# Patient Record
Sex: Female | Born: 2000 | Race: White | Hispanic: No | Marital: Single | Smoking: Never smoker
Health system: Southern US, Community
[De-identification: ages and names within clinical notes are randomized; demographics above are authoritative.]

## PROBLEM LIST (undated history)

## (undated) DIAGNOSIS — F909 Attention-deficit hyperactivity disorder, unspecified type: Secondary | ICD-10-CM

## (undated) DIAGNOSIS — J020 Streptococcal pharyngitis: Secondary | ICD-10-CM

---
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
## 2022-01-28 DIAGNOSIS — R634 Abnormal weight loss: Secondary | ICD-10-CM | POA: Diagnosis not present

## 2022-01-28 DIAGNOSIS — R002 Palpitations: Secondary | ICD-10-CM | POA: Diagnosis not present

## 2022-01-28 DIAGNOSIS — N390 Urinary tract infection, site not specified: Secondary | ICD-10-CM | POA: Diagnosis not present

## 2022-01-28 DIAGNOSIS — Z3202 Encounter for pregnancy test, result negative: Secondary | ICD-10-CM | POA: Diagnosis not present

## 2022-01-28 DIAGNOSIS — A499 Bacterial infection, unspecified: Secondary | ICD-10-CM | POA: Diagnosis not present

## 2022-01-28 DIAGNOSIS — J029 Acute pharyngitis, unspecified: Secondary | ICD-10-CM | POA: Diagnosis not present

## 2022-01-28 DIAGNOSIS — R590 Localized enlarged lymph nodes: Secondary | ICD-10-CM | POA: Diagnosis not present

## 2022-01-28 DIAGNOSIS — R7309 Other abnormal glucose: Secondary | ICD-10-CM | POA: Diagnosis not present

## 2022-01-28 DIAGNOSIS — E061 Subacute thyroiditis: Secondary | ICD-10-CM | POA: Diagnosis not present

## 2022-02-06 ENCOUNTER — Emergency Department
Admission: EM | Admit: 2022-02-06 | Discharge: 2022-02-06 | Disposition: A | Discharge status: Home / Self Care | Payer: 59 | Attending: Emergency Medicine | Admitting: Emergency Medicine

## 2022-02-06 ENCOUNTER — Emergency Department: Payer: 59

## 2022-02-06 DIAGNOSIS — R002 Palpitations: Secondary | ICD-10-CM | POA: Diagnosis not present

## 2022-02-06 DIAGNOSIS — F419 Anxiety disorder, unspecified: Secondary | ICD-10-CM | POA: Diagnosis not present

## 2022-02-06 DIAGNOSIS — R200 Anesthesia of skin: Secondary | ICD-10-CM | POA: Insufficient documentation

## 2022-02-06 DIAGNOSIS — R0789 Other chest pain: Secondary | ICD-10-CM | POA: Diagnosis not present

## 2022-02-06 DIAGNOSIS — R258 Other abnormal involuntary movements: Secondary | ICD-10-CM | POA: Insufficient documentation

## 2022-02-06 DIAGNOSIS — R69 Illness, unspecified: Secondary | ICD-10-CM | POA: Diagnosis not present

## 2022-02-06 DIAGNOSIS — R9431 Abnormal electrocardiogram [ECG] [EKG]: Secondary | ICD-10-CM | POA: Diagnosis not present

## 2022-02-06 LAB — BASIC METABOLIC PANEL
Anion gap: 5 (ref 5–15)
BUN: 15 mg/dL (ref 6–20)
CO2: 25 mmol/L (ref 22–32)
Calcium: 8.8 mg/dL — ABNORMAL LOW (ref 8.9–10.3)
Chloride: 105 mmol/L (ref 98–111)
Creatinine, Ser: 0.62 mg/dL (ref 0.44–1.00)
GFR, Estimated: >60 mL/min (ref >60)
Glucose, Bld: 84 mg/dL (ref 70–99)
Potassium: 3.8 mmol/L (ref 3.5–5.1)
Sodium: 135 mmol/L (ref 135–145)

## 2022-02-06 LAB — CBC
HCT: 40.7 % (ref 36.0–46.0)
Hemoglobin: 13.3 g/dL (ref 12.0–15.0)
MCH: 27.3 pg (ref 26.0–34.0)
MCHC: 32.7 g/dL (ref 30.0–36.0)
MCV: 83.6 fL (ref 80.0–100.0)
Platelets: 374 10*3/uL (ref 150–400)
RBC: 4.87 MIL/uL (ref 3.87–5.11)
RDW: 14.7 % (ref 11.5–15.5)
WBC: 11 10*3/uL — ABNORMAL HIGH (ref 4.0–10.5)
nRBC: 0 % (ref 0.0–0.2)

## 2022-02-06 LAB — TROPONIN I (HIGH SENSITIVITY): Troponin I (High Sensitivity): <2 ng/L (ref <18)

## 2022-02-06 LAB — TSH: TSH: 1.093 u[IU]/mL (ref 0.350–4.500)

## 2022-02-06 LAB — POC URINE PREG, ED: Preg Test, Ur: NEGATIVE

## 2022-02-06 MED ORDER — HYDROXYZINE HCL 10 MG PO TABS: 10.0000 mg | ORAL_TABLET | Freq: Three times a day (TID) | ORAL | 0 refills | Status: AC | PRN

## 2022-02-06 NOTE — ED Provider Notes (Signed)
Medical screening examination/treatment/procedure(s) were conducted as a shared visit with non-physician practitioner(s) and myself.  I personally evaluated the patient during the encounter. ? ? ?ED ECG REPORT ?Monika Salk, the attending physician, personally viewed and interpreted this ECG. ? ?Date: 02/06/2022 ?EKG Time: 1113 ?Rate: 80 ?Rhythm: normal sinus rhythm ?QRS Axis: normal ?Intervals: normal ?ST/T Wave abnormalities: normal ?Narrative Interpretation: no evidence of acute ischemia ? ? ?Vitals:  ? 02/06/22 1101  ?BP: 114/81  ?Pulse: 77  ?Resp: 18  ?Temp: 99.4 ?F (37.4 ?C)  ?SpO2: 96%  ? ? ? ?Patient resting comfortably at this time.  No acute distress. ? ?Return precautions and treatment recommendations and follow-up discussed with the patient who is agreeable with the plan.  Encouraged patient to follow-up with endocrinology as planned with [facility].  Also advised patient not to drive within 8 hours of use of Atarax ? ?  Delman Kitten, MD ?02/06/22 1348 ? ?

## 2022-02-06 NOTE — ED Triage Notes (Signed)
Pt states she was seen and dx with thyroiditis 4/4 by PCP, states she has still been having heart palpations, "attacks" with leg numbness, feeling like she is immanent need to run that last a few minutes. States her HR  has been up to 160's at times. ?

## 2022-02-06 NOTE — ED Provider Notes (Signed)
? ?[facility] ?Provider Note ? ? ? None  ?  (approximate) ? ? ?History  ? ?Palpitations ? ? ?HPI ? ?Andrea Farley is a 21 y.o. female  with recent diagnosis of thyroiditis and as listed in EMR presents to the emergency department for evaluation of palpitations, leg numbness, palpitations, shaking, sweating, symptoms x 2 weeks. Finished prednisone, atenolol, and cefdinir for diagnosis of subacute thyroiditis. Symptoms were somewhat better while taking these medications, but did not completely resolve. She has had some new stressors. ? ?  ? ? ?Physical Exam  ? ?Triage Vital Signs: ?ED Triage Vitals  ?Enc Vitals Group  ?   BP 02/06/22 1101 114/81  ?   Pulse Rate 02/06/22 1101 77  ?   Resp 02/06/22 1101 18  ?   Temp 02/06/22 1101 99.4 ?F (37.4 ?C)  ?   Temp Source 02/06/22 1101 Oral  ?   SpO2 02/06/22 1101 96 %  ?   Weight 02/06/22 1102 129 lb (58.5 kg)  ?   Height 02/06/22 1102 5\' 4"  (1.626 m)  ?   Head Circumference --   ?   Peak Flow --   ?   Pain Score 02/06/22 1102 6  ?   Pain Loc --   ?   Pain Edu? --   ?   Excl. in [place]? --   ? ? ?Most recent vital signs: ?Vitals:  ? 02/06/22 1101 02/06/22 1407  ?BP: 114/81 (!) 103/59  ?Pulse: 77 75  ?Resp: 18 16  ?Temp: 99.4 ?F (37.4 ?C)   ?SpO2: 96% 100%  ? ? ?General: Awake, no distress.  ?CV:  Good peripheral perfusion.  ?Resp:  Normal effort. Breath sounds clear ?Abd:  No distention. Soft and nontender ?Other:  Calm ? ? ?ED Results / Procedures / Treatments  ? ?Labs ?(all labs ordered are listed, but only abnormal results are displayed) ?Labs Reviewed  ?BASIC METABOLIC PANEL - Abnormal; Notable for the following components:  ?    Result Value  ? Calcium 8.8 (*)   ? All other components within normal limits  ?CBC - Abnormal; Notable for the following components:  ? WBC 11.0 (*)   ? All other components within normal limits  ?TSH  ?POC URINE PREG, ED  ?TROPONIN I (HIGH SENSITIVITY)  ? ? ? ?EKG ? ?ED ECG REPORT ?I, 02/08/22, FNP-BC personally viewed  and interpreted this ECG. ? ? Date: 02/06/2022 ? EKG Time: 1112 ? Rate: 81 ? Rhythm: normal EKG, normal sinus rhythm ? Axis: normal ? Intervals:none ? ST&T Change: no change ? ? ? ?RADIOLOGY ? ?Ima chest x-ray negative for acute cardiopulmonary abnormality.  Ge and radiology report reviewed by me. ? ? ?PROCEDURES: ? ?Critical Care performed: No ? ?Procedures ? ? ?MEDICATIONS ORDERED IN ED: ?Medications - No data to display ? ? ?IMPRESSION / MDM / ASSESSMENT AND PLAN / ED COURSE  ? ?I have reviewed the triage note. ? ?Differential diagnosis includes, but is not limited to thyroid disorder, anxiety. ? ?Imaging, EKG, and labs are all reassuring.  Symptoms most are most likely related to anxiety.  She will be encouraged to keep her appointment with the endocrinologist and follow-up with primary care or counselor as well.  If symptoms change or worsen and she is unable to be evaluated by primary care if she is to return to the emergency department.  Prescription for Atarax submitted to her pharmacy. ? ?  ? ? ?FINAL CLINICAL IMPRESSION(S) / ED DIAGNOSES  ? ?  Final diagnoses:  ?Anxiety  ? ? ? ?Rx / DC Orders  ? ?ED Discharge Orders   ? ?      Ordered  ?  hydrOXYzine (ATARAX) 10 MG tablet  3 times daily PRN       ? 02/06/22 1348  ? ?  ?  ? ?  ? ? ? ?Note:  This document was prepared using Dragon voice recognition software and may include unintentional dictation errors. ?  Chinita Pester, FNP ?02/06/22 1642 ? ?  ?Sharyn Creamer, MD ?02/07/22 0825 ? ?

## 2022-02-06 NOTE — ED Notes (Signed)
Pt given cup for urine sample 

## 2022-02-06 NOTE — ED Notes (Signed)
Pt taken for xray

## 2022-02-26 DIAGNOSIS — D485 Neoplasm of uncertain behavior of skin: Secondary | ICD-10-CM | POA: Diagnosis not present

## 2022-02-26 DIAGNOSIS — D225 Melanocytic nevi of trunk: Secondary | ICD-10-CM | POA: Diagnosis not present

## 2022-02-26 DIAGNOSIS — W891XXA Exposure to tanning bed, initial encounter: Secondary | ICD-10-CM | POA: Diagnosis not present

## 2022-04-30 DIAGNOSIS — R5383 Other fatigue: Secondary | ICD-10-CM | POA: Diagnosis not present

## 2022-04-30 DIAGNOSIS — Z30011 Encounter for initial prescription of contraceptive pills: Secondary | ICD-10-CM | POA: Diagnosis not present

## 2022-04-30 DIAGNOSIS — R42 Dizziness and giddiness: Secondary | ICD-10-CM | POA: Diagnosis not present

## 2022-04-30 DIAGNOSIS — R002 Palpitations: Secondary | ICD-10-CM | POA: Diagnosis not present

## 2022-06-06 DIAGNOSIS — E559 Vitamin D deficiency, unspecified: Secondary | ICD-10-CM | POA: Diagnosis not present

## 2022-06-06 DIAGNOSIS — Z131 Encounter for screening for diabetes mellitus: Secondary | ICD-10-CM | POA: Diagnosis not present

## 2022-06-06 DIAGNOSIS — E611 Iron deficiency: Secondary | ICD-10-CM | POA: Diagnosis not present

## 2022-06-06 DIAGNOSIS — E538 Deficiency of other specified B group vitamins: Secondary | ICD-10-CM | POA: Diagnosis not present

## 2022-08-19 IMAGING — CR DG CHEST 2V
1 series · 2 of 2 positions shown · non-contrast
Comparison: Chest/rib radiographs 02/03/2020.

CLINICAL DATA: Chest pain/tightness.

EXAM:
CHEST - 2 VIEW

[Series 1: dg chest 2 view · 0.14mm/px · 2 of 2 slices shown]
[im 1/2]
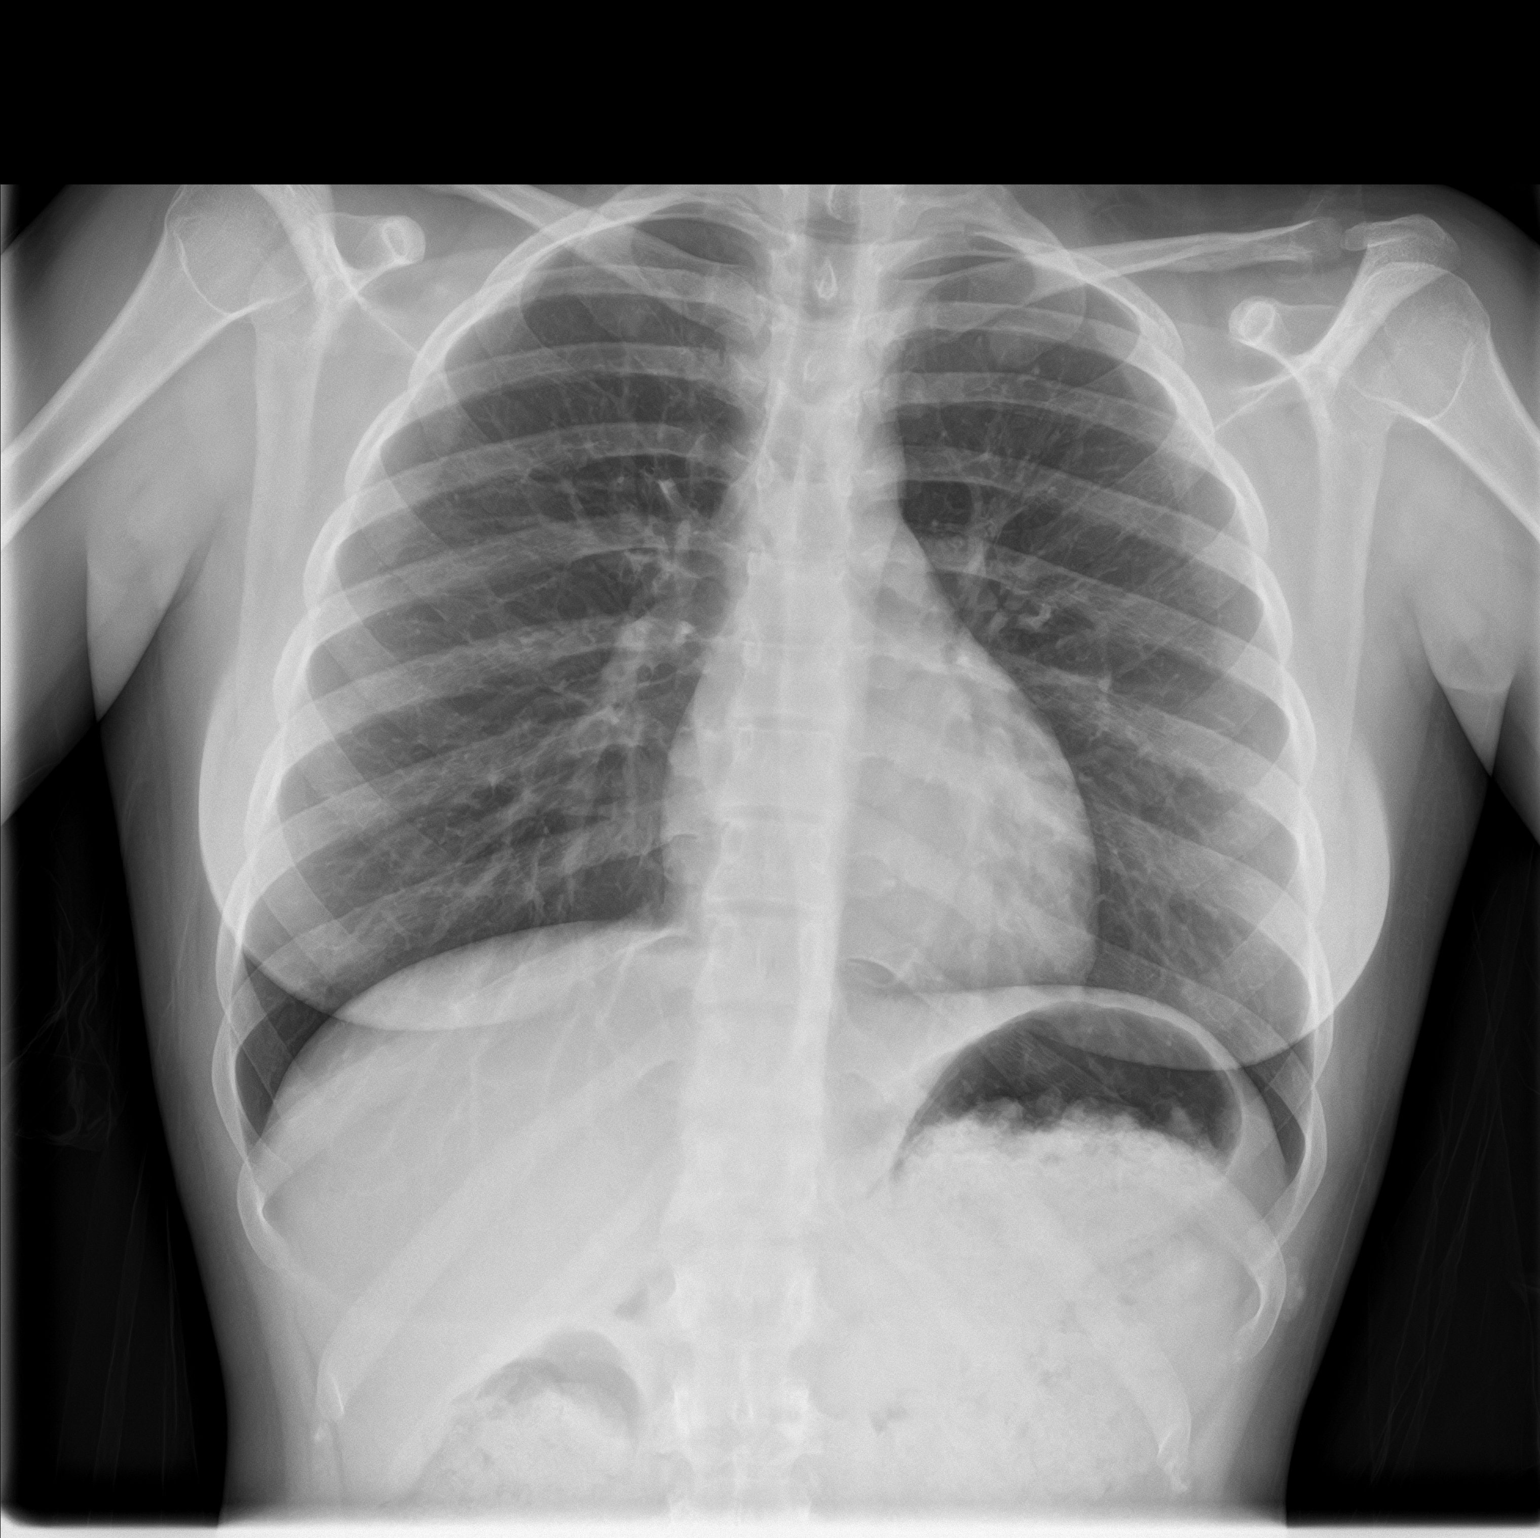
[im 2/2]
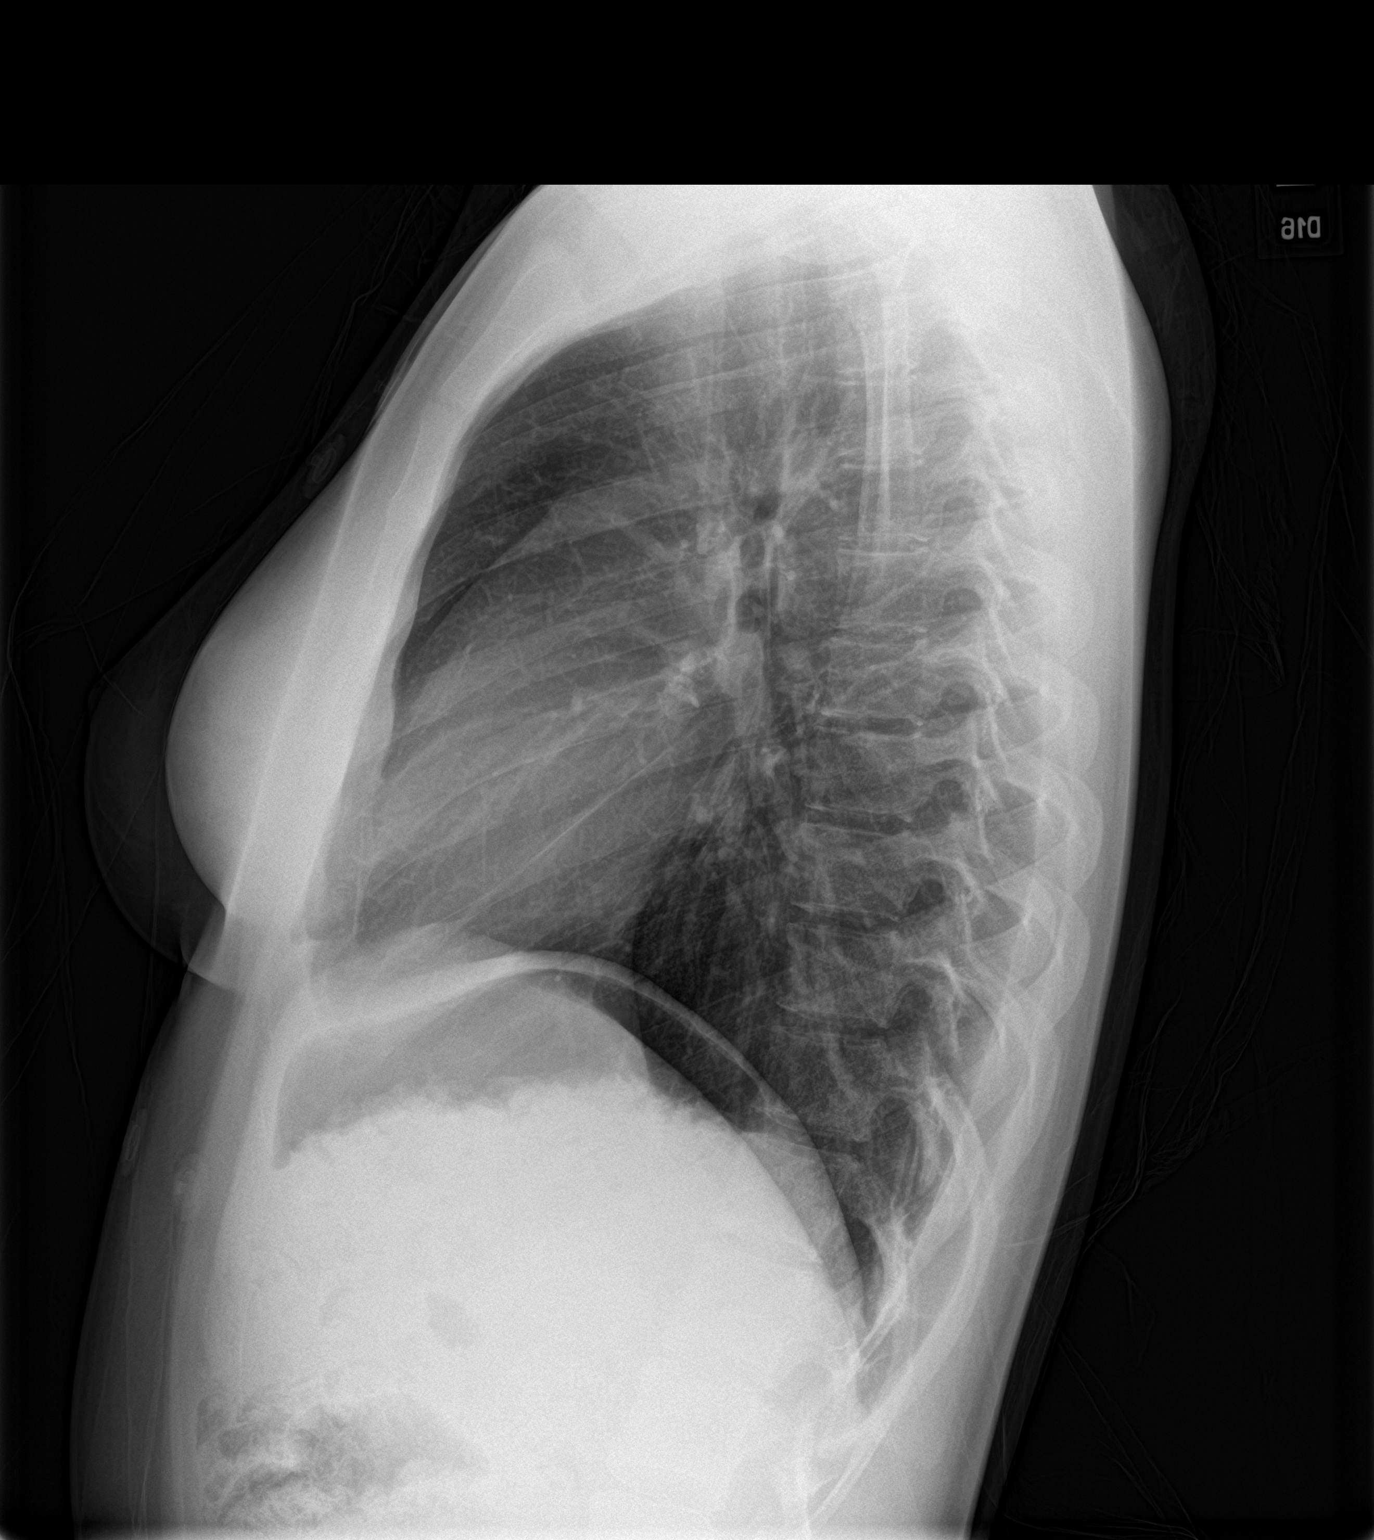

[2 of 2 positions shown; findings below may reference images not displayed]

FINDINGS: The cardiomediastinal silhouette is within normal limits. The lungs
are well inflated and clear. There is no evidence of pleural
effusion or pneumothorax. No acute osseous abnormality is
identified.
IMPRESSION: No active cardiopulmonary disease.

## 2022-09-04 DIAGNOSIS — Z03818 Encounter for observation for suspected exposure to other biological agents ruled out: Secondary | ICD-10-CM | POA: Diagnosis not present

## 2022-09-04 DIAGNOSIS — Z20822 Contact with and (suspected) exposure to covid-19: Secondary | ICD-10-CM | POA: Diagnosis not present

## 2022-10-15 DIAGNOSIS — U071 COVID-19: Secondary | ICD-10-CM | POA: Diagnosis not present

## 2023-03-04 DIAGNOSIS — D2261 Melanocytic nevi of right upper limb, including shoulder: Secondary | ICD-10-CM | POA: Diagnosis not present

## 2023-03-04 DIAGNOSIS — D2262 Melanocytic nevi of left upper limb, including shoulder: Secondary | ICD-10-CM | POA: Diagnosis not present

## 2023-03-04 DIAGNOSIS — D2272 Melanocytic nevi of left lower limb, including hip: Secondary | ICD-10-CM | POA: Diagnosis not present

## 2023-03-04 DIAGNOSIS — L559 Sunburn, unspecified: Secondary | ICD-10-CM | POA: Diagnosis not present

## 2023-03-04 DIAGNOSIS — D2271 Melanocytic nevi of right lower limb, including hip: Secondary | ICD-10-CM | POA: Diagnosis not present

## 2023-03-04 DIAGNOSIS — L910 Hypertrophic scar: Secondary | ICD-10-CM | POA: Diagnosis not present

## 2023-03-04 DIAGNOSIS — L578 Other skin changes due to chronic exposure to nonionizing radiation: Secondary | ICD-10-CM | POA: Diagnosis not present

## 2023-03-04 DIAGNOSIS — D225 Melanocytic nevi of trunk: Secondary | ICD-10-CM | POA: Diagnosis not present

## 2023-04-06 DIAGNOSIS — Z309 Encounter for contraceptive management, unspecified: Secondary | ICD-10-CM | POA: Diagnosis not present

## 2023-04-06 DIAGNOSIS — Z1331 Encounter for screening for depression: Secondary | ICD-10-CM | POA: Diagnosis not present

## 2023-04-06 DIAGNOSIS — Z113 Encounter for screening for infections with a predominantly sexual mode of transmission: Secondary | ICD-10-CM | POA: Diagnosis not present

## 2024-01-05 DIAGNOSIS — F411 Generalized anxiety disorder: Secondary | ICD-10-CM | POA: Diagnosis not present

## 2024-01-19 DIAGNOSIS — F603 Borderline personality disorder: Secondary | ICD-10-CM | POA: Diagnosis not present

## 2024-01-28 DIAGNOSIS — F603 Borderline personality disorder: Secondary | ICD-10-CM | POA: Diagnosis not present

## 2024-02-06 DIAGNOSIS — J029 Acute pharyngitis, unspecified: Secondary | ICD-10-CM | POA: Diagnosis not present

## 2024-02-06 DIAGNOSIS — J111 Influenza due to unidentified influenza virus with other respiratory manifestations: Secondary | ICD-10-CM | POA: Diagnosis not present

## 2024-02-06 DIAGNOSIS — Z03818 Encounter for observation for suspected exposure to other biological agents ruled out: Secondary | ICD-10-CM | POA: Diagnosis not present

## 2024-02-12 DIAGNOSIS — F603 Borderline personality disorder: Secondary | ICD-10-CM | POA: Diagnosis not present

## 2024-02-25 DIAGNOSIS — F603 Borderline personality disorder: Secondary | ICD-10-CM | POA: Diagnosis not present

## 2024-09-14 ENCOUNTER — Other Ambulatory Visit: Payer: Self-pay

## 2024-09-14 DIAGNOSIS — Z716 Tobacco abuse counseling: Secondary | ICD-10-CM | POA: Insufficient documentation

## 2024-09-14 DIAGNOSIS — F419 Anxiety disorder, unspecified: Secondary | ICD-10-CM | POA: Diagnosis not present

## 2024-09-14 NOTE — ED Notes (Addendum)
 Pt belongings:  Purple jacket Green pants [employer]   Belongings sent home with mom
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
## 2024-09-15 ENCOUNTER — Emergency Department
Admission: EM | Admit: 2024-09-15 | Discharge: 2024-09-15 | Disposition: A | Discharge status: Home / Self Care | Attending: Emergency Medicine | Admitting: Emergency Medicine

## 2024-09-15 DIAGNOSIS — F419 Anxiety disorder, unspecified: Secondary | ICD-10-CM

## 2024-09-15 DIAGNOSIS — Z716 Tobacco abuse counseling: Secondary | ICD-10-CM

## 2024-09-15 LAB — COMPREHENSIVE METABOLIC PANEL WITH GFR
ALT: 7 U/L (ref 0–44)
AST: 25 U/L (ref 15–41)
Albumin: 4.5 g/dL (ref 3.5–5.0)
Alkaline Phosphatase: 79 U/L (ref 38–126)
Anion gap: 12 (ref 5–15)
BUN: 9 mg/dL (ref 6–20)
CO2: 22 mmol/L (ref 22–32)
Calcium: 9.3 mg/dL (ref 8.9–10.3)
Chloride: 104 mmol/L (ref 98–111)
Creatinine, Ser: 0.62 mg/dL (ref 0.44–1.00)
GFR, Estimated: >60 mL/min (ref >60)
Glucose, Bld: 133 mg/dL — ABNORMAL HIGH (ref 70–99)
Potassium: 4.1 mmol/L (ref 3.5–5.1)
Sodium: 137 mmol/L (ref 135–145)
Total Bilirubin: 0.2 mg/dL (ref 0.0–1.2)
Total Protein: 7.7 g/dL (ref 6.5–8.1)

## 2024-09-15 LAB — URINE DRUG SCREEN
Amphetamines: NEGATIVE
Barbiturates: NEGATIVE
Benzodiazepines: NEGATIVE
Cocaine: NEGATIVE
Fentanyl: NEGATIVE
Methadone Scn, Ur: NEGATIVE
Opiates: NEGATIVE
Tetrahydrocannabinol: NEGATIVE

## 2024-09-15 LAB — CBC
HCT: 40.6 % (ref 36.0–46.0)
Hemoglobin: 13.3 g/dL (ref 12.0–15.0)
MCH: 28.9 pg (ref 26.0–34.0)
MCHC: 32.8 g/dL (ref 30.0–36.0)
MCV: 88.3 fL (ref 80.0–100.0)
Platelets: 315 K/uL (ref 150–400)
RBC: 4.6 MIL/uL (ref 3.87–5.11)
RDW: 12.2 % (ref 11.5–15.5)
WBC: 8.6 K/uL (ref 4.0–10.5)
nRBC: 0 % (ref 0.0–0.2)

## 2024-09-15 MED ORDER — HYDROXYZINE HCL 10 MG PO TABS
10.0000 mg | ORAL_TABLET | Freq: Three times a day (TID) | ORAL | 0 refills | Status: AC | PRN
Start: 2024-09-15 — End: ?

## 2024-09-15 MED ORDER — NICOTINE 7 MG/24HR TD PT24: 7.0000 mg | MEDICATED_PATCH | Freq: Every day | TRANSDERMAL | 0 refills | Status: AC

## 2024-09-15 MED ORDER — HYDROXYZINE HCL 25 MG PO TABS
25.0000 mg | ORAL_TABLET | Freq: Once | ORAL | Status: AC
  Administered 2024-09-15: 25 mg via ORAL
  Filled 2024-09-15: qty 1

## 2024-09-15 NOTE — Discharge Instructions (Signed)
 Take hydroxyzine  as prescribed, only as needed for anxiety.  Use the nicotine patch as prescribed.  Follow-up with your primary doctor to discuss your anxiety and intrusive thoughts, and discuss stress management techniques as well as whether it is appropriate at that time to start an anxiety medication.  Thank you for choosing us  for your health care today!  Please see your primary doctor this week for a follow up appointment.   If you have any new, worsening, or unexpected symptoms call your doctor right away or come back to the emergency department for reevaluation.  It was my pleasure to care for you today.   Ginnie EDISON Cyrena, MD

## 2024-09-15 NOTE — ED Notes (Signed)
 Gave pt cup of water at this time.

## 2024-09-15 NOTE — ED Provider Notes (Addendum)
 [facility] Provider Note    Event Date/Time   First MD Initiated Contact with Patient 09/15/24 0019     (approximate)   History   Psychiatric Evaluation   HPI  Andrea Farley is a 23 y.o. female   Past medical history of anxiety who presents with intrusive thoughts.  Was on her phone and saw some reports of people harming other people and could not put these thoughts out of her mind.  She had no intention of hurting others or herself.  She had no audio or visual hallucinations.  She denies drug or alcohol use.   External Medical Documents Reviewed: Previous outpatient notes      Physical Exam   Triage Vital Signs: ED Triage Vitals  Encounter Vitals Group     BP 09/14/24 2346 (!) 153/119     Girls Systolic BP Percentile --      Girls Diastolic BP Percentile --      Boys Systolic BP Percentile --      Boys Diastolic BP Percentile --      Pulse Rate 09/14/24 2346 (!) 120     Resp 09/14/24 2346 20     Temp 09/14/24 2346 98.4 F (36.9 C)     Temp src --      SpO2 09/14/24 2346 100 %     Weight 09/14/24 2347 142 lb (64.4 kg)     Height 09/14/24 2347 5' 4 (1.626 m)     Head Circumference --      Peak Flow --      Pain Score 09/14/24 2346 0     Pain Loc --      Pain Education --      Exclude from Growth Chart --     Most recent vital signs: Vitals:   09/14/24 2346  BP: (!) 153/119  Pulse: (!) 120  Resp: 20  Temp: 98.4 F (36.9 C)  SpO2: 100%    General: Awake, no distress.  CV:  Good peripheral perfusion.  Resp:  Normal effort.  Abd:  No distention.  Other:  Pleasant young woman in no acute distress, mentation sharp, oriented, and appropriate.  No obvious trauma.  Does not appear intoxicated nor in acute withdrawal.   ED Results / Procedures / Treatments   Labs (all labs ordered are listed, but only abnormal results are displayed) Labs Reviewed  COMPREHENSIVE METABOLIC PANEL WITH GFR - Abnormal; Notable for the following  components:      Result Value   Glucose, Bld 133 (*)    All other components within normal limits  CBC  URINE DRUG SCREEN  ETHANOL  SALICYLATE LEVEL  ACETAMINOPHEN  LEVEL  POC URINE PREG, ED     I ordered and reviewed the above labs they are notable for cell counts and electrolytes unremarkable.   PROCEDURES:  Critical Care performed: No  Procedures   MEDICATIONS ORDERED IN ED: Medications  hydrOXYzine  (ATARAX ) tablet 25 mg (25 mg Oral Given 09/15/24 0125)     IMPRESSION / MDM / ASSESSMENT AND PLAN / ED COURSE  I reviewed the triage vital signs and the nursing notes.                                Patient's presentation is most consistent with acute presentation with potential threat to life or bodily function.  Differential diagnosis includes, but is not limited to, anxiety, acute decompensated psychiatric illness, substance-induced  mood disorder   MDM:   Intrusive thoughts after seeing a disturbing video/news report, with no acute or imminent threat to herself or others around her.  Denies audiovisual hallucinations drug or alcohol use except for vaping nicotine which she has been trying to quit.  She offers no other acute medical complaints.  She looks well.  I see no reason to IVC.  Defer psychiatric evaluation given very low suspicion for decompensated psychiatric illness that would require psychiatric hospitalization at this time, she does have adequate follow-up with primary doctor who can start longer-term generalized anxiety disorder medication should these anxieties continue to interfere with her daily living.  In the interim, I will have her cut back on her nicotine use, prescribe nicotine patch, and give her Atarax  as needed for at home use and 1 in the emergency department tonight.  I considered hospitalization for admission or observation however given her well appearance, unremarkable evaluation, I think she can follow-up outpatient as scheduled with  her PMD in a couple of weeks.   -- I spent 5 minutes counseling this patient on smoking cessation.  We spoke about the patient's current tobacco use, impact of smoking, assessed willingness to quit, methods for cessation including medical management and nicotine replacement therapy (which I prescribed to the patient) and advised follow-up with primary doctor to continue to address smoking cessation.        FINAL CLINICAL IMPRESSION(S) / ED DIAGNOSES   Final diagnoses:  Anxiety  Encounter for smoking cessation counseling     Rx / DC Orders   ED Discharge Orders          Ordered    hydrOXYzine  (ATARAX ) 10 MG tablet  3 times daily PRN        09/15/24 0126    nicotine (NICODERM CQ - DOSED IN MG/24 HR) 7 mg/24hr patch  Daily        09/15/24 0126             Note:  This document was prepared using Dragon voice recognition software and may include unintentional dictation errors.    Cyrena Mylar, MD 09/15/24 [id number]    Cyrena Mylar, MD 09/15/24 [phone]

## 2024-09-26 DIAGNOSIS — F32A Depression, unspecified: Secondary | ICD-10-CM | POA: Diagnosis not present

## 2024-09-26 DIAGNOSIS — Z13220 Encounter for screening for lipoid disorders: Secondary | ICD-10-CM | POA: Diagnosis not present

## 2024-09-26 DIAGNOSIS — F419 Anxiety disorder, unspecified: Secondary | ICD-10-CM | POA: Diagnosis not present

## 2024-09-26 DIAGNOSIS — Z23 Encounter for immunization: Secondary | ICD-10-CM | POA: Diagnosis not present

## 2024-09-26 DIAGNOSIS — Z1329 Encounter for screening for other suspected endocrine disorder: Secondary | ICD-10-CM | POA: Diagnosis not present

## 2024-09-26 DIAGNOSIS — Z1331 Encounter for screening for depression: Secondary | ICD-10-CM | POA: Diagnosis not present
